# Patient Record
Sex: Male | Born: 1995 | Hispanic: No | Marital: Single | Smoking: Light tobacco smoker
Health system: Southern US, Community
[De-identification: ages and names within clinical notes are randomized; demographics above are authoritative.]

---

## 2019-01-24 ENCOUNTER — Emergency Department (HOSPITAL_COMMUNITY): Payer: BC Managed Care – PPO

## 2019-01-24 ENCOUNTER — Other Ambulatory Visit: Payer: Self-pay

## 2019-01-24 ENCOUNTER — Encounter (HOSPITAL_COMMUNITY): Payer: Self-pay

## 2019-01-24 DIAGNOSIS — Z5321 Procedure and treatment not carried out due to patient leaving prior to being seen by health care provider: Secondary | ICD-10-CM | POA: Diagnosis not present

## 2019-01-24 DIAGNOSIS — R0789 Other chest pain: Secondary | ICD-10-CM | POA: Diagnosis present

## 2019-01-24 LAB — BASIC METABOLIC PANEL
Anion gap: 8 (ref 5–15)
BUN: 17 mg/dL (ref 6–20)
CO2: 25 mmol/L (ref 22–32)
Calcium: 8.9 mg/dL (ref 8.9–10.3)
Chloride: 104 mmol/L (ref 98–111)
Creatinine, Ser: 0.85 mg/dL (ref 0.61–1.24)
GFR calc Af Amer: >60 mL/min (ref >60)
GFR calc non Af Amer: >60 mL/min (ref >60)
Glucose, Bld: 92 mg/dL (ref 70–99)
Potassium: 3.9 mmol/L (ref 3.5–5.1)
Sodium: 137 mmol/L (ref 135–145)

## 2019-01-24 LAB — CBC
HCT: 44.5 % (ref 39.0–52.0)
Hemoglobin: 15.1 g/dL (ref 13.0–17.0)
MCH: 30.1 pg (ref 26.0–34.0)
MCHC: 33.9 g/dL (ref 30.0–36.0)
MCV: 88.6 fL (ref 80.0–100.0)
Platelets: 210 10*3/uL (ref 150–400)
RBC: 5.02 MIL/uL (ref 4.22–5.81)
RDW: 12.9 % (ref 11.5–15.5)
WBC: 7.7 10*3/uL (ref 4.0–10.5)
nRBC: 0 % (ref 0.0–0.2)

## 2019-01-24 LAB — TROPONIN I (HIGH SENSITIVITY): Troponin I (High Sensitivity): <2 ng/L (ref <18)

## 2019-01-24 MED ORDER — SODIUM CHLORIDE 0.9% FLUSH: 3.0000 mL | Freq: Once | INTRAVENOUS | Status: DC

## 2019-01-24 NOTE — ED Triage Notes (Signed)
Pt states over the past several days he can feel his heart racing. Today while driving, he experienced an episode, went home and took a nap. Pt states that he also has a mild headache and that his left arm and shoulder starts tingling at times. EKG done in triage.

## 2019-01-25 ENCOUNTER — Emergency Department (HOSPITAL_COMMUNITY)
Admission: EM | Admit: 2019-01-25 | Discharge: 2019-01-25 | Disposition: A | Discharge status: Home / Self Care | Payer: BC Managed Care – PPO | Attending: Emergency Medicine | Admitting: Emergency Medicine

## 2019-01-25 NOTE — ED Notes (Signed)
Called for recheck of V/S x1 No answer 

## 2020-06-29 IMAGING — CR DG CHEST 2V
2 series · 2 of 2 positions shown · non-contrast
Comparison: None.

CLINICAL DATA: Chest pain.

EXAM:
CHEST - 2 VIEW

[w chest pa]
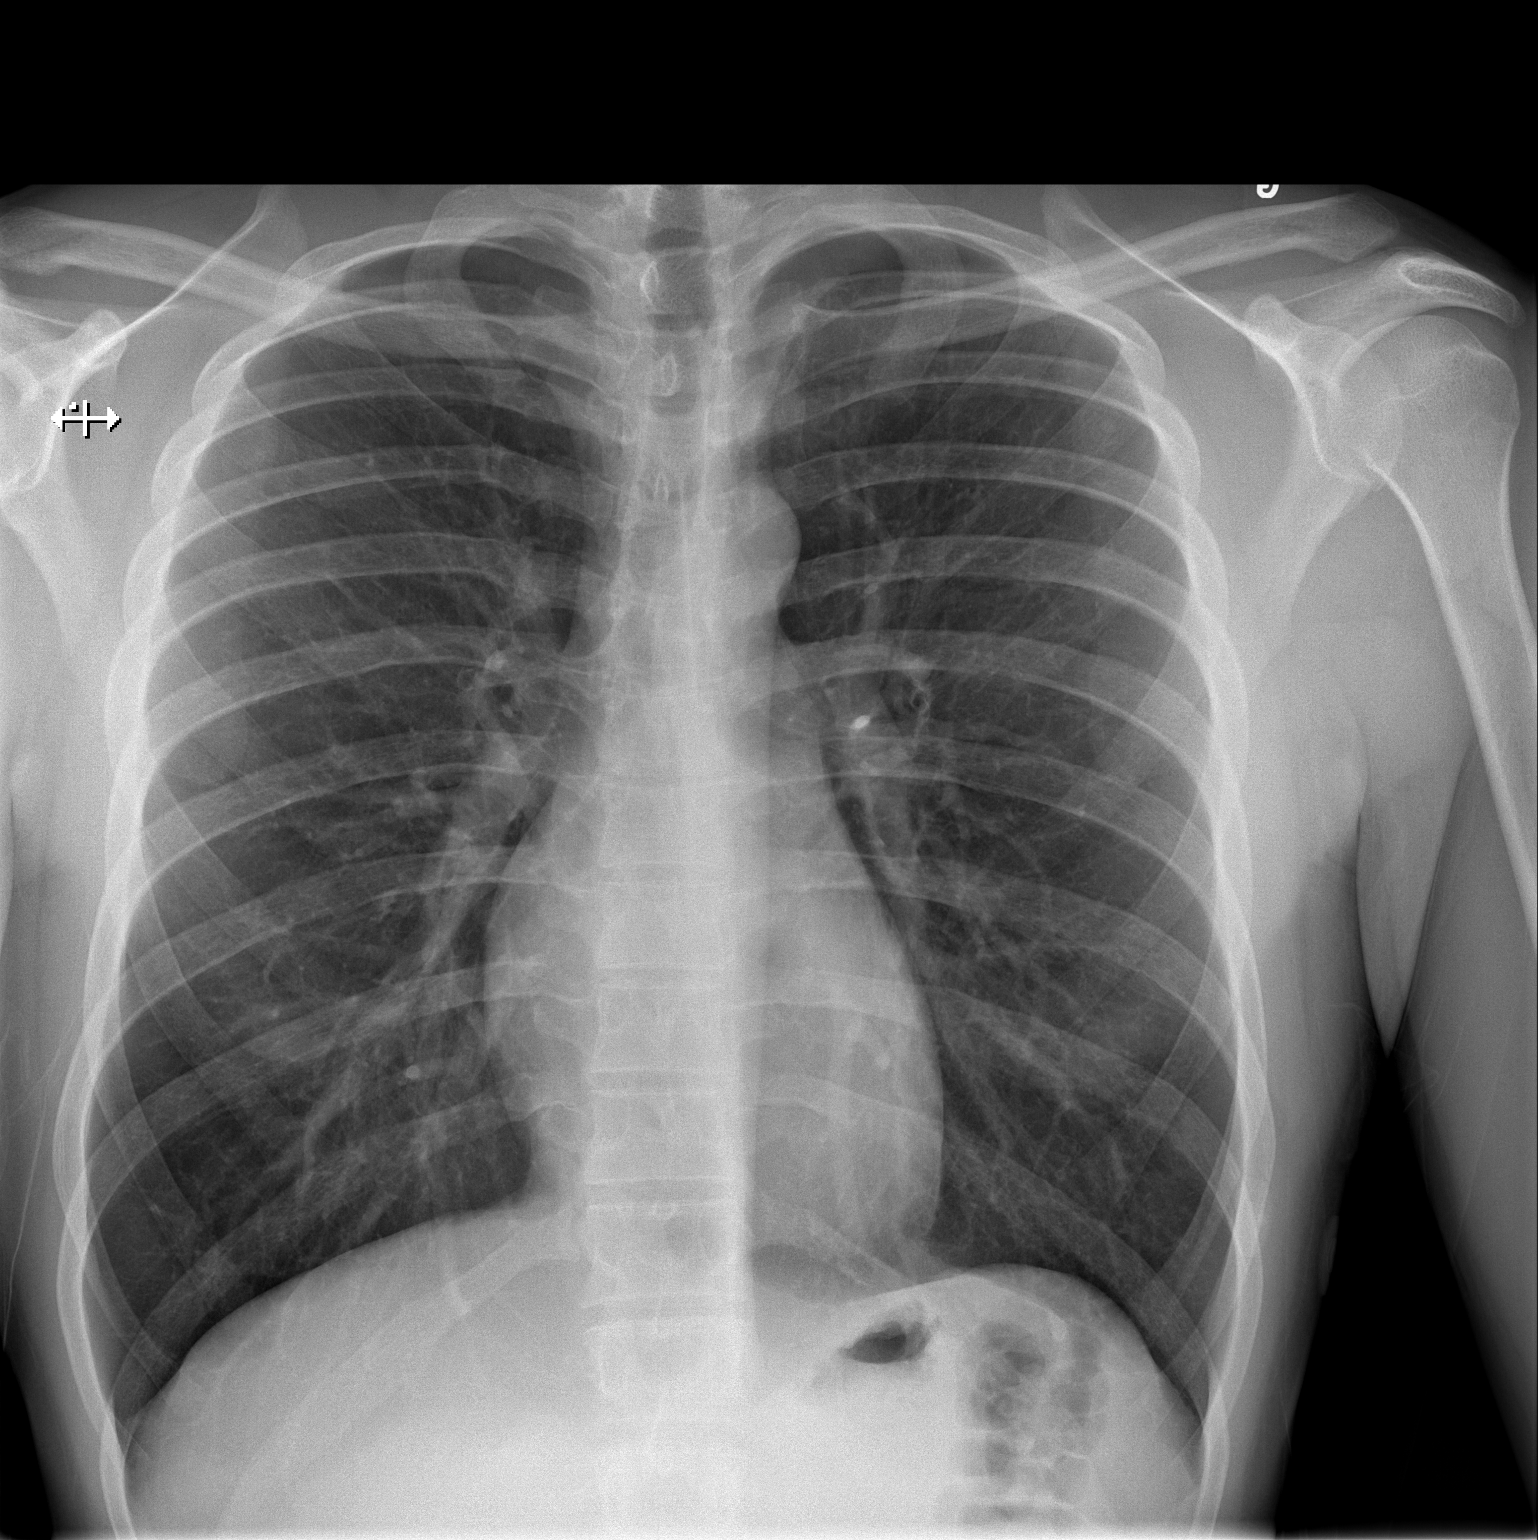

[w chest lat]
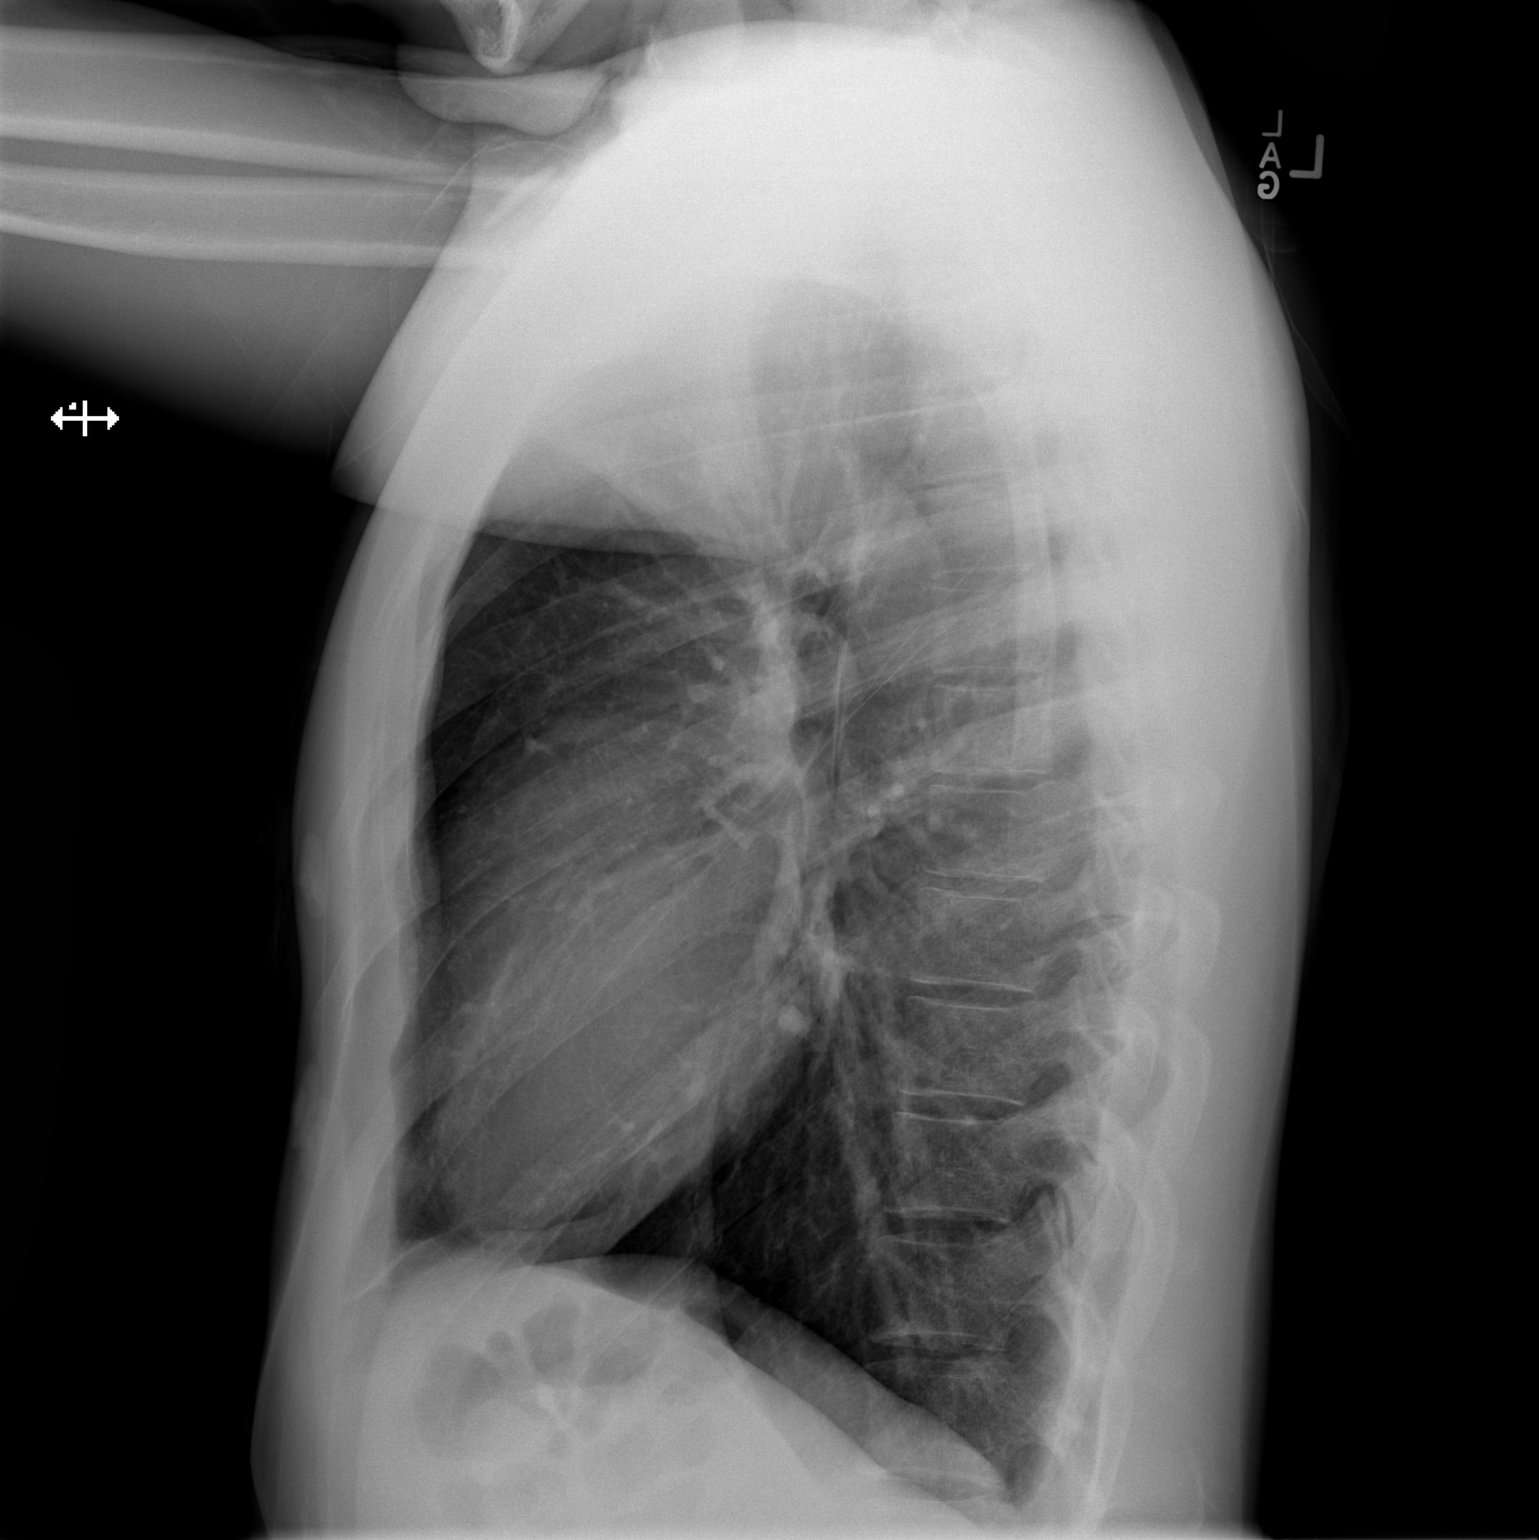

[2 of 2 positions shown; findings below may reference images not displayed]

FINDINGS: The heart size and mediastinal contours are within normal limits.
Both lungs are clear. The visualized skeletal structures are
unremarkable.
IMPRESSION: Negative two view chest x-ray
